# Patient Record
Sex: Male | Born: 1975 | Race: White | Hispanic: No | Marital: Single | Smoking: Current some day smoker
Health system: Southern US, Community
[De-identification: ages and names within clinical notes are randomized; demographics above are authoritative.]

---

## 1999-09-08 ENCOUNTER — Encounter: Payer: Self-pay | Admitting: Emergency Medicine

## 1999-09-08 ENCOUNTER — Emergency Department (HOSPITAL_COMMUNITY): Admission: EM | Admit: 1999-09-08 | Discharge: 1999-09-08 | Payer: Self-pay | Admitting: Emergency Medicine

## 2001-08-18 ENCOUNTER — Encounter: Payer: Self-pay | Admitting: Emergency Medicine

## 2001-08-18 ENCOUNTER — Emergency Department (HOSPITAL_COMMUNITY): Admission: EM | Admit: 2001-08-18 | Discharge: 2001-08-18 | Payer: Self-pay | Admitting: Emergency Medicine

## 2001-11-26 ENCOUNTER — Encounter: Payer: Self-pay | Admitting: Emergency Medicine

## 2001-11-26 ENCOUNTER — Emergency Department (HOSPITAL_COMMUNITY): Admission: EM | Admit: 2001-11-26 | Discharge: 2001-11-26 | Payer: Self-pay | Admitting: Emergency Medicine

## 2004-10-03 ENCOUNTER — Emergency Department (HOSPITAL_COMMUNITY): Admission: EM | Admit: 2004-10-03 | Discharge: 2004-10-03 | Payer: Self-pay | Admitting: Emergency Medicine

## 2007-05-21 ENCOUNTER — Emergency Department (HOSPITAL_COMMUNITY): Admission: EM | Admit: 2007-05-21 | Discharge: 2007-05-22 | Payer: Self-pay | Admitting: Emergency Medicine

## 2007-05-22 ENCOUNTER — Ambulatory Visit: Payer: Self-pay | Admitting: Psychiatry

## 2007-05-22 ENCOUNTER — Inpatient Hospital Stay (HOSPITAL_COMMUNITY): Admission: AD | Admit: 2007-05-22 | Discharge: 2007-05-25 | Payer: Self-pay | Admitting: Psychiatry

## 2009-03-14 ENCOUNTER — Emergency Department (HOSPITAL_COMMUNITY): Admission: EM | Admit: 2009-03-14 | Discharge: 2009-03-14 | Payer: Self-pay | Admitting: Emergency Medicine

## 2009-07-31 ENCOUNTER — Emergency Department (HOSPITAL_COMMUNITY): Admission: EM | Admit: 2009-07-31 | Discharge: 2009-07-31 | Payer: Self-pay | Admitting: Emergency Medicine

## 2011-01-31 ENCOUNTER — Emergency Department (HOSPITAL_COMMUNITY)
Admission: EM | Admit: 2011-01-31 | Discharge: 2011-01-31 | Disposition: A | Discharge status: Home / Self Care | Payer: Self-pay | Attending: Emergency Medicine | Admitting: Emergency Medicine

## 2011-01-31 DIAGNOSIS — H9209 Otalgia, unspecified ear: Secondary | ICD-10-CM | POA: Insufficient documentation

## 2011-01-31 DIAGNOSIS — J029 Acute pharyngitis, unspecified: Secondary | ICD-10-CM | POA: Insufficient documentation

## 2011-01-31 LAB — URINALYSIS, ROUTINE W REFLEX MICROSCOPIC
Glucose, UA: 100 mg/dL — AB
Hgb urine dipstick: NEGATIVE
Ketones, ur: 15 mg/dL — AB
Leukocytes, UA: NEGATIVE
Nitrite: NEGATIVE
Protein, ur: 30 mg/dL — AB
Specific Gravity, Urine: 1.034 — ABNORMAL HIGH (ref 1.005–1.030)
Urobilinogen, UA: 1 mg/dL (ref 0.0–1.0)
pH: 7 (ref 5.0–8.0)

## 2011-01-31 LAB — URINE MICROSCOPIC-ADD ON

## 2011-01-31 LAB — RAPID STREP SCREEN (MED CTR MEBANE ONLY): Streptococcus, Group A Screen (Direct): NEGATIVE
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
## 2011-04-06 NOTE — Discharge Summary (Signed)
NAME:  Vernon Reyes, Vernon Reyes                ACCOUNT NO.:  [id number]   MEDICAL RECORD NO.:  [id number]          PATIENT TYPE:  IPS   LOCATION:  0500                          FACILITY:  [facility]   PHYSICIAN:  Geoffery Lyons, M.D.      DATE OF BIRTH:  1976/08/09   DATE OF ADMISSION:  05/22/2007  DATE OF DISCHARGE:  05/25/2007                               DISCHARGE SUMMARY   CHIEF COMPLAINT AND PRESENT ILLNESS:  This was the first admission to  [facility] for this 35 year old white male,  voluntarily admitted.  Presented in the emergency room, reported  suicidal thoughts.  Endorsed having suicidal thoughts for about 3 days  with a plan to overdose.  No history of prior attempts.  Endorsed  suicidal thoughts come from his misery of being addicted to alcohol and  drugs.  Using cocaine, alcohol and marijuana regularly most days.  Last  relapse 1-1/2 months after 5 months of abstinence.  Claimed that the  relapse was triggered just by the boredom and irritability with the AA  meetings, and the urge to use.  Longest period of abstinence 4 years.  Requesting detox from alcohol.  Has been working to get accepted at [facility].   PAST PSYCHIATRIC HISTORY:  First time at [facility].  Has history  of prior admissions, most recently to [facility] in 2007 and [facility].  History of 10 years of polysubstance dependence.  Had been active  in AA in the past to remain abstinent.  Denies any history of mood  disorders or attention.   ALCOHOL AND DRUG HISTORY:  As already stated, persistent use of  substances.  See history of present illness.   MEDICAL HISTORY:  Noncontributory.   MEDICATIONS:  None.   PHYSICAL EXAMINATION:  Performed.  Failed to show any acute findings.   LABORATORY DATA:  Magnesium 2.2.  UDS positive for cocaine.  Blood  chemistry within normal limits.   MENTAL STATUS EXAM:  An alert, cooperative male, somewhat disheveled.  Engaged in conversations.  Speech was  normal in pace, tone and  production.  Mood was anxious.  Thought process was logical, coherent  and relevant.  No suicidal/homicidal ideas.  No delusions.  No  hallucinations.  Cognition well-preserved.   ADMISSION DIAGNOSES:  AXIS I:  Alcohol, cocaine, marijuana abuse; rule  out dependence, rule out substance-induced mood disorder.  AXIS II:  No diagnosis.  AXIS III:  No diagnosis.  AXIS IV:  Moderate.  AXIS V:  Upon admission 28.  Highest global assessment of functioning in  the last year 60.   COURSE IN THE HOSPITAL:  He was admitted.  He was started in individual  and group psychotherapy.  He was detoxified with Librium.  He was given  trazodone for sleep.  Endorse that he was dealing with a lot of shame  because he used to come as a speaker to [employer].  Can not quit using cocaine.  Has been abstinent up to 2-1/2 months.  Relapses after he gets bored.  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 and has been staying in the streets this last  week.  Had been working towards going to [facility].  Encouraged,  motivated, as he felt this it was it for him.  Endorsed also issues with  relationships, some codependency.  If he was not in a relationship he  would be using.  When he was in [place], he claimed he got more so into  a relationship with a male, and got out of track.  Endorsed that he  did know that he needed to do this for himself.  We continued to work to  detox.  Worked on relapse prevention plan.  By July 6th, he was in full  contact with reality.  There were no active suicidal/homicidal ideas.  No hallucinations, no delusions.  He was going to be staying with a  friend on this night, and will go to [facility] in the morning.  Endorsed he was ready to make a change.   DISCHARGE DIAGNOSES:  AXIS I:  Cocaine dependence.  Alcohol, marijuana  abuse.  Substance-induced mood disorder.  AXIS II:  No diagnosis.  AXIS III:  No  diagnosis.  AXIS IV: Moderate.  AXIS V:  Upon discharge 60.   Discharged on no medications.   To follow up at [facility].      Geoffery Lyons, M.D.  Electronically Signed     IL/MEDQ  D:  06/23/2007  T:  06/24/2007  Job:  [id number]

## 2011-09-04 LAB — HEPATIC FUNCTION PANEL
ALT: 13
Alkaline Phosphatase: 74
Bilirubin, Direct: 0.1
Indirect Bilirubin: 0.5
Total Bilirubin: 0.6

## 2011-09-04 LAB — RAPID URINE DRUG SCREEN, HOSP PERFORMED
Barbiturates: NOT DETECTED
Cocaine: POSITIVE — AB
Opiates: NOT DETECTED
Tetrahydrocannabinol: NOT DETECTED

## 2011-09-04 LAB — CBC
Hemoglobin: 13.5
RBC: 4.25
RDW: 13

## 2011-09-04 LAB — BASIC METABOLIC PANEL
Calcium: 9.1
GFR calc Af Amer: >60
GFR calc non Af Amer: >60
Glucose, Bld: 90
Sodium: 139

## 2011-09-04 LAB — DIFFERENTIAL
Basophils Absolute: 0.1
Lymphocytes Relative: 41
Monocytes Absolute: 0.9 — ABNORMAL HIGH
Neutro Abs: 4.1

## 2014-06-12 ENCOUNTER — Emergency Department (HOSPITAL_COMMUNITY)
Admission: EM | Admit: 2014-06-12 | Discharge: 2014-06-12 | Disposition: A | Discharge status: Home / Self Care | Payer: PRIVATE HEALTH INSURANCE | Attending: Emergency Medicine | Admitting: Emergency Medicine

## 2014-06-12 ENCOUNTER — Emergency Department (HOSPITAL_COMMUNITY): Payer: PRIVATE HEALTH INSURANCE

## 2014-06-12 ENCOUNTER — Encounter (HOSPITAL_COMMUNITY): Payer: Self-pay | Admitting: Emergency Medicine

## 2014-06-12 DIAGNOSIS — IMO0001 Reserved for inherently not codable concepts without codable children: Secondary | ICD-10-CM | POA: Insufficient documentation

## 2014-06-12 DIAGNOSIS — R4182 Altered mental status, unspecified: Secondary | ICD-10-CM | POA: Insufficient documentation

## 2014-06-12 DIAGNOSIS — F17200 Nicotine dependence, unspecified, uncomplicated: Secondary | ICD-10-CM | POA: Insufficient documentation

## 2014-06-12 DIAGNOSIS — Z008 Encounter for other general examination: Secondary | ICD-10-CM | POA: Insufficient documentation

## 2014-06-12 DIAGNOSIS — F1410 Cocaine abuse, uncomplicated: Secondary | ICD-10-CM | POA: Insufficient documentation

## 2014-06-12 LAB — I-STAT TROPONIN, ED: Troponin i, poc: 0 ng/mL (ref 0.00–0.08)

## 2014-06-12 LAB — COMPREHENSIVE METABOLIC PANEL
ALK PHOS: 75 U/L (ref 39–117)
ALT: 9 U/L (ref 0–53)
ANION GAP: 13 (ref 5–15)
AST: 13 U/L (ref 0–37)
Albumin: 3.9 g/dL (ref 3.5–5.2)
BILIRUBIN TOTAL: 0.2 mg/dL — AB (ref 0.3–1.2)
BUN: 24 mg/dL — AB (ref 6–23)
CO2: 26 meq/L (ref 19–32)
Calcium: 9.2 mg/dL (ref 8.4–10.5)
Chloride: 105 mEq/L (ref 96–112)
Creatinine, Ser: 1.11 mg/dL (ref 0.50–1.35)
GFR, EST NON AFRICAN AMERICAN: 82 mL/min — AB (ref >90)
GLUCOSE: 105 mg/dL — AB (ref 70–99)
POTASSIUM: 3.7 meq/L (ref 3.7–5.3)
SODIUM: 144 meq/L (ref 137–147)
Total Protein: 6.9 g/dL (ref 6.0–8.3)

## 2014-06-12 LAB — CBC WITH DIFFERENTIAL/PLATELET
BASOS ABS: 0 10*3/uL (ref 0.0–0.1)
BASOS PCT: 0 % (ref 0–1)
EOS ABS: 0.4 10*3/uL (ref 0.0–0.7)
Eosinophils Relative: 7 % — ABNORMAL HIGH (ref 0–5)
HCT: 39.1 % (ref 39.0–52.0)
Hemoglobin: 13.1 g/dL (ref 13.0–17.0)
Lymphocytes Relative: 33 % (ref 12–46)
Lymphs Abs: 2.1 10*3/uL (ref 0.7–4.0)
MCH: 30 pg (ref 26.0–34.0)
MCHC: 33.5 g/dL (ref 30.0–36.0)
MCV: 89.5 fL (ref 78.0–100.0)
MONOS PCT: 8 % (ref 3–12)
Monocytes Absolute: 0.5 10*3/uL (ref 0.1–1.0)
NEUTROS PCT: 52 % (ref 43–77)
Neutro Abs: 3.3 10*3/uL (ref 1.7–7.7)
PLATELETS: 218 10*3/uL (ref 150–400)
RBC: 4.37 MIL/uL (ref 4.22–5.81)
RDW: 12.6 % (ref 11.5–15.5)
WBC: 6.4 10*3/uL (ref 4.0–10.5)

## 2014-06-12 LAB — URINALYSIS, ROUTINE W REFLEX MICROSCOPIC
Bilirubin Urine: NEGATIVE
GLUCOSE, UA: NEGATIVE mg/dL
Hgb urine dipstick: NEGATIVE
KETONES UR: NEGATIVE mg/dL
LEUKOCYTES UA: NEGATIVE
Nitrite: NEGATIVE
PROTEIN: 30 mg/dL — AB
Specific Gravity, Urine: 1.025 (ref 1.005–1.030)
UROBILINOGEN UA: 1 mg/dL (ref 0.0–1.0)
pH: 6 (ref 5.0–8.0)

## 2014-06-12 LAB — ACETAMINOPHEN LEVEL

## 2014-06-12 LAB — RAPID URINE DRUG SCREEN, HOSP PERFORMED
AMPHETAMINES: NOT DETECTED
Barbiturates: NOT DETECTED
Benzodiazepines: NOT DETECTED
Cocaine: POSITIVE — AB
Opiates: NOT DETECTED
TETRAHYDROCANNABINOL: NOT DETECTED

## 2014-06-12 LAB — URINE MICROSCOPIC-ADD ON

## 2014-06-12 LAB — TROPONIN I
Troponin I: <0.3 ng/mL (ref <0.30)
Troponin I: <0.3 ng/mL (ref <0.30)

## 2014-06-12 LAB — SALICYLATE LEVEL

## 2014-06-12 LAB — ETHANOL: Alcohol, Ethyl (B): <11 mg/dL (ref 0–11)

## 2014-06-12 MED ORDER — SODIUM CHLORIDE 0.9 % IV BOLUS (SEPSIS)
1000.0000 mL | Freq: Once | INTRAVENOUS | Status: AC
  Administered 2014-06-12: 1000 mL via INTRAVENOUS

## 2014-06-12 MED ORDER — AMMONIA AROMATIC IN INHA
0.3000 mL | Freq: Once | RESPIRATORY_TRACT | Status: DC
  Filled 2014-06-12: qty 10

## 2014-06-12 NOTE — ED Notes (Signed)
Pt to department via EMS- pt reports that he uses crack and drinks alcohol on a daily basis. Pt reports that the last use was this morning around 0645 for both. Pt was found unresponsive by a bystander and [employer] called. States that he started having nausea/vomiting, and diaphoresis. Pt given 4mg  Zofran IM. Bp-115/78 Hr-61.

## 2014-06-12 NOTE — ED Provider Notes (Signed)
CSN: [id number]     Arrival date & time 06/12/14  6962 History   First MD Initiated Contact with Patient 06/12/14 [phone]     Chief Complaint  Patient presents with  . Medical Clearance  . Nausea   HPI Comments: Patient is a 38 y.o. Male who presents to the [facility] ED via EMS after being found unrepsonsive in his car in a parking lot by police.  Police took the patient into custody originally and said the patient was lethargic and had some slurred speech.  Patient told the police that he had done cocaine and drank.  En route to jail the patient became very diaphoretic and stated that he didn't feel good to the officers.  He was transferred by EMS to the hospital at that time.  Patient was unable to provide any history given mental status change.    The history is provided by the patient. No language interpreter was used.    History reviewed. No pertinent past medical history. History reviewed. No pertinent past surgical history. History reviewed. No pertinent family history. History  Substance Use Topics  . Smoking status: Current Some Day Smoker  . Smokeless tobacco: Not on file  . Alcohol Use: Yes    Review of Systems  Unable to perform ROS: Mental status change   2:00 pm Patient was awake and alert enough to have a conversation.   He states that he has some generalized myalgia and is very tired at this time.  He states that he has been up for multiple days on a bender.  He denies chest pain, shortness of breath, belly pain.   Allergies  Review of patient's allergies indicates no known allergies.  Home Medications   Prior to Admission medications   Not on File   BP 120/82  Pulse 65  Temp(Src) 97.6 F (36.4 C) (Oral)  Resp 13  SpO2 100% Physical Exam  Nursing note and vitals reviewed. Constitutional: He appears well-developed and well-nourished. No distress.  HENT:  Head: Normocephalic and atraumatic.  Mouth/Throat: Oropharynx is clear and moist. No oropharyngeal exudate.   Eyes: Conjunctivae are normal. Pupils are equal, round, and reactive to light.  Pupils 2 mm bilaterally  Neck: Normal range of motion. Neck supple. No JVD present. No thyromegaly present.  Cardiovascular: Normal rate, regular rhythm, normal heart sounds and intact distal pulses.  Exam reveals no gallop and no friction rub.   No murmur heard. Pulmonary/Chest: Effort normal and breath sounds normal. No respiratory distress. He has no wheezes. He has no rales. He exhibits no tenderness.  Abdominal: Soft. Bowel sounds are normal. He exhibits no distension and no mass. There is no tenderness. There is no rebound and no guarding.  Musculoskeletal: Normal range of motion.  Lymphadenopathy:    He has no cervical adenopathy.  Neurological:  GCS score 9, Patient is lethargic but will respond to sternal rub.    Skin: Skin is warm and dry. He is not diaphoretic.   2:30 pm - Patient is alert and oriented x 4.  GCS 15.  ED Course  Procedures (including critical care time) Labs Review Labs Reviewed  COMPREHENSIVE METABOLIC PANEL - Abnormal; Notable for the following:    Glucose, Bld 105 (*)    BUN 24 (*)    Total Bilirubin 0.2 (*)    GFR calc non Af Amer 82 (*)    All other components within normal limits  CBC WITH DIFFERENTIAL - Abnormal; Notable for the following:    Eosinophils  Relative 7 (*)    All other components within normal limits  URINALYSIS, ROUTINE W REFLEX MICROSCOPIC - Abnormal; Notable for the following:    Protein, ur 30 (*)    All other components within normal limits  URINE RAPID DRUG SCREEN (HOSP PERFORMED) - Abnormal; Notable for the following:    Cocaine POSITIVE (*)    All other components within normal limits  SALICYLATE LEVEL - Abnormal; Notable for the following:    Salicylate Lvl <2.0 (*)    All other components within normal limits  TROPONIN I  ACETAMINOPHEN LEVEL  ETHANOL  URINE MICROSCOPIC-ADD ON  TROPONIN I  Rosezena SensorI-STAT TROPOININ, ED    Imaging Review Dg  Chest 2 View  06/12/2014   CLINICAL DATA:  MEDICAL CLEARANCE NAUSEA  EXAM: CHEST - 2 VIEW  COMPARISON:  None available  FINDINGS: Lungs are clear. Heart size and mediastinal contours are within normal limits. No effusion. Visualized skeletal structures are unremarkable.  IMPRESSION: No acute cardiopulmonary disease.   Electronically Signed   By: Oley Balmaniel  Hassell M.D.   On: 06/12/2014 15:43   Ct Head Wo Contrast  06/12/2014   CLINICAL DATA:  Nausea.  EXAM: CT HEAD WITHOUT CONTRAST  TECHNIQUE: Contiguous axial images were obtained from the base of the skull through the vertex without intravenous contrast.  COMPARISON:  No priors.  FINDINGS: No acute intracranial abnormalities. Specifically, no evidence of acute intracranial hemorrhage, no definite findings of acute/subacute cerebral ischemia, no mass, mass effect, hydrocephalus or abnormal intra or extra-axial fluid collections. Visualized paranasal sinuses and mastoids are well pneumatized, with exception of the right maxillary sinus. The right maxillary sinus is completely opacified with secretions that are heterogeneous in attenuation (likely inspissated), and appears mildly expansile, suggesting a chronic mucocele. No acute displaced skull fractures are identified.  IMPRESSION: *No acute intracranial abnormalities. *The appearance of the brain is normal. *Right maxillary sinus mucocele.   Electronically Signed   By: Trudie Reedaniel  Entrikin M.D.   On: 06/12/2014 10:46     EKG Interpretation   Date/Time:  Saturday June 12 2014 15:43:30 EDT Ventricular Rate:  59 PR Interval:  186 QRS Duration: 103 QT Interval:  413 QTC Calculation: 409 R Axis:   77 Text Interpretation:  Sinus rhythm RSR' in V1 or V2, probably normal  variant ST elev, probable normal early repol pattern No significant change  was found Confirmed by Manus GunningANCOUR  MD, STEPHEN [phone]([id number]) on 06/12/2014 4:11:33  PM      MDM   Final diagnoses:  Cocaine abuse  Altered mental status, unspecified  altered mental status type   Patient presents to the ED via EMS for nausea, substance abuse, and altered mental status.  Patient was found to be cocaine positive here in the ED.  Alcohol, salicylate, tylenol levels were negative here in the ED.  UA shows no signs of infection at this time.  Given cocaine use patient had CXR which was negative for acute cardiopulmonary pathology at this time.  Initial troponin was negative.  EKG showed diffuse J point elevation in nearly all leads as read by Dr. Manus Gunningancour.  Delta troponin here is negative.  Given altered mental status upon arrival head CT was performed.  Head CT shows no acute abnormalities at this time.  Patient is stable at this time and can be taken into the custody of the [employer].  Patient ambulated in the hall and was able to tolerate PO prior to leaving.  Patient was seen by Dr. Manus Gunningancour alongside me and he  is comfortable with the above plan.        Eben Burow, PA-C 06/12/14 [phone]

## 2014-06-12 NOTE — ED Notes (Signed)
Spoke to [place] in lab regarding ETOH, acetaminophen, and salicylate delay. Lab reports needing a redraw for testing.

## 2014-06-12 NOTE — ED Notes (Signed)
Pt refusing in and out cath. Pt attempting to use urinal again

## 2014-06-12 NOTE — ED Notes (Signed)
Dr. Rancour at bedside. 

## 2014-06-12 NOTE — ED Notes (Signed)
Pt asleep; unable to provide urine sample. Pt to be cathed for UDS

## 2014-06-12 NOTE — ED Notes (Signed)
PA at bedside.

## 2014-06-12 NOTE — ED Notes (Signed)
Pt more lethargic. Ammonia inhalant used; pt alert at this time. Attempting to use urinal at this time

## 2014-06-12 NOTE — ED Provider Notes (Signed)
Medical screening examination/treatment/procedure(s) were conducted as a shared visit with non-physician practitioner(s) and myself.  I personally evaluated the patient during the encounter.  BIB police after being found sleeping in car.  Endorses cocaine use.  No trauma.  Sleepy, arousable to pain and voice.  Oriented x3.  No meningismus. Denies head, neck, chest, abdominal, or back pain. No focal neuro deficits.   EKG Interpretation   Date/Time:  Saturday June 12 2014 15:43:30 EDT Ventricular Rate:  59 PR Interval:  186 QRS Duration: 103 QT Interval:  413 QTC Calculation: 409 R Axis:   77 Text Interpretation:  Sinus rhythm RSR' in V1 or V2, probably normal  variant ST elev, probable normal early repol pattern No significant change  was found Confirmed by Manus GunningANCOUR  MD, Nihal Marzella [phone]([id number]) on 06/12/2014 4:11:33  PM       Glynn OctaveStephen Zanyiah Posten, MD 06/12/14 [phone]

## 2014-06-12 NOTE — ED Notes (Signed)
Spoke to Vernon Reyes in the main lab about UDS; reports UDS not ran at time of collection; test in progree at this time; PA aware

## 2014-06-12 NOTE — Discharge Instructions (Signed)
Stimulant Use Disorder-Cocaine °Cocaine is one of a group of powerful drugs called stimulants. Cocaine has medical uses for stopping nosebleeds and for pain control before minor nose or dental surgery. However, cocaine is misused because of the effects that it produces. These effects include:  °· A feeling of extreme pleasure. °· Alertness. °· High energy. °Common street names for cocaine include coke, crack, blow, snow, and nose candy. Cocaine is snorted, dissolved in water and injected, or smoked.  °Stimulants are addictive because they activate regions of the brain that produce both the pleasurable sensation of "reward" and psychological dependence. Together, these actions account for loss of control and the rapid development of drug dependence. This means you become ill without the drug (withdrawal) and need to keep using it to function.  °Stimulant use disorder is use of stimulants that disrupts your daily life. It disrupts relationships with family and friends and how you do your job. Cocaine increases your blood pressure and heart rate. It can cause a heart attack or stroke. Cocaine can also cause death from irregular heart rate or seizures. °SYMPTOMS °Symptoms of stimulant use disorder with cocaine include: °· Use of cocaine in larger amounts or over a longer period of time than intended. °· Unsuccessful attempts to cut down or control cocaine use. °· A lot of time spent obtaining, using, or recovering from the effects of cocaine. °· A strong desire or urge to use cocaine (craving). °· Continued use of cocaine in spite of major problems at work, school, or home because of use. °· Continued use of cocaine in spite of relationship problems because of use. °· Giving up or cutting down on important life activities because of cocaine use. °· Use of cocaine over and over in situations when it is physically hazardous, such as driving a car. °· Continued use of cocaine in spite of a physical problem that is likely  related to use. Physical problems can include: °¨ Malnutrition. °¨ Nosebleeds. °¨ Chest pain. °¨ High blood pressure. °¨ A hole that develops between the part of your nose that separates your nostrils (perforated nasal septum). °¨ Lung and kidney damage. °· Continued use of cocaine in spite of a mental problem that is likely related to use. Mental problems can include: °¨ Schizophrenia-like symptoms. °¨ Depression. °¨ Bipolar mood swings. °¨ Anxiety. °¨ Sleep problems. °· Need to use more and more cocaine to get the same effect, or lessened effect over time with use of the same amount of cocaine (tolerance). °· Having withdrawal symptoms when cocaine use is stopped, or using cocaine to reduce or avoid withdrawal symptoms. Withdrawal symptoms include: °¨ Depressed or irritable mood. °¨ Low energy or restlessness. °¨ Bad dreams. °¨ Poor or excessive sleep. °¨ Increased appetite. °DIAGNOSIS °Stimulant use disorder is diagnosed by your health care provider. You may be asked questions about your cocaine use and how it affects your life. A physical exam may be done. A drug screen may be ordered. You may be referred to a mental health professional. The diagnosis of stimulant use disorder requires at least two symptoms within 12 months. The type of stimulant use disorder depends on the number of signs and symptoms you have. The type may be: °· Mild. Two or three signs and symptoms. °· Moderate. Four or five signs and symptoms. °· Severe. Six or more signs and symptoms. °TREATMENT °Treatment for stimulant use disorder is usually provided by mental health professionals with training in substance use disorders. The following options are available: °·   Counseling or talk therapy. Talk therapy addresses the reasons you use cocaine and ways to keep you from using again. Goals of talk therapy include: °¨ Identifying and avoiding triggers for use. °¨ Handling cravings. °¨ Replacing use with healthy activities. °· Support groups.  Support groups provide emotional support, advice, and guidance. °· Medicine. Certain medicines may decrease cocaine cravings or withdrawal symptoms. °HOME CARE INSTRUCTIONS °· Take medicines only as directed by your health care provider. °· Identify the people and activities that trigger your cocaine use and avoid them. °· Keep all follow-up visits as directed by your health care provider. °SEEK MEDICAL CARE IF: °· Your symptoms get worse or you relapse. °· You are not able to take medicines as directed. °SEEK IMMEDIATE MEDICAL CARE IF: °· You have serious thoughts about hurting yourself or others. °· You have a seizure, chest pain, sudden weakness, or loss of speech or vision. °FOR MORE INFORMATION °· National Institute on Drug Abuse: www.drugabuse.gov °· Substance Abuse and Mental Health Services Administration: www.samhsa.gov °Document Released: 11/02/2000 Document Revised: 03/22/2014 Document Reviewed: 11/18/2013 °ExitCare® Patient Information ©2015 [employer]. This information is not intended to replace advice given to you by your health care provider. Make sure you discuss any questions you have with your health care provider. ° °

## 2014-06-12 NOTE — ED Notes (Signed)
Pt in [employer] custody after being found "unresponsive" in car . Pt denies pain at this time. Pt reports ETOH and crack use. Last ETOH use today; Last crack use an hour ago.

## 2014-08-16 ENCOUNTER — Emergency Department (HOSPITAL_COMMUNITY)
Admission: EM | Admit: 2014-08-16 | Discharge: 2014-08-16 | Disposition: A | Discharge status: Home / Self Care | Payer: PRIVATE HEALTH INSURANCE | Attending: Emergency Medicine | Admitting: Emergency Medicine

## 2014-08-16 ENCOUNTER — Encounter (HOSPITAL_COMMUNITY): Payer: Self-pay | Admitting: Emergency Medicine

## 2014-08-16 DIAGNOSIS — L03317 Cellulitis of buttock: Secondary | ICD-10-CM | POA: Diagnosis present

## 2014-08-16 DIAGNOSIS — L0231 Cutaneous abscess of buttock: Secondary | ICD-10-CM | POA: Diagnosis present

## 2014-08-16 DIAGNOSIS — R110 Nausea: Secondary | ICD-10-CM | POA: Diagnosis not present

## 2014-08-16 DIAGNOSIS — F17200 Nicotine dependence, unspecified, uncomplicated: Secondary | ICD-10-CM | POA: Insufficient documentation

## 2014-08-16 MED ORDER — IBUPROFEN 800 MG PO TABS: 800.0000 mg | ORAL_TABLET | Freq: Three times a day (TID) | ORAL | Status: AC

## 2014-08-16 MED ORDER — DOXYCYCLINE HYCLATE 100 MG PO TABS
100.0000 mg | ORAL_TABLET | Freq: Once | ORAL | Status: AC
  Administered 2014-08-16: 100 mg via ORAL
  Filled 2014-08-16: qty 1

## 2014-08-16 MED ORDER — DOXYCYCLINE HYCLATE 100 MG PO CAPS: 100.0000 mg | ORAL_CAPSULE | Freq: Two times a day (BID) | ORAL | Status: AC

## 2014-08-16 MED ORDER — LIDOCAINE HCL (PF) 2 % IJ SOLN
10.0000 mL | Freq: Once | INTRAMUSCULAR | Status: AC
  Administered 2014-08-16: 10 mL via INTRADERMAL
  Filled 2014-08-16: qty 10

## 2014-08-16 MED ORDER — IBUPROFEN 800 MG PO TABS
800.0000 mg | ORAL_TABLET | Freq: Once | ORAL | Status: AC
  Administered 2014-08-16: 800 mg via ORAL

## 2014-08-16 MED ORDER — IBUPROFEN 800 MG PO TABS
ORAL_TABLET | ORAL | Status: AC
  Administered 2014-08-16: 800 mg via ORAL
  Filled 2014-08-16: qty 1

## 2014-08-16 MED ORDER — OXYCODONE-ACETAMINOPHEN 5-325 MG PO TABS
1.0000 | ORAL_TABLET | Freq: Once | ORAL | Status: AC
  Administered 2014-08-16: 1 via ORAL
  Filled 2014-08-16: qty 1

## 2014-08-16 NOTE — ED Notes (Signed)
Pt comes in from [place], officer with pt, pt has a large boil on buttock,onset 2 weeks, pt has been asking for antibiotic from nurse, pt has been given motrin. Pt is concerned cause he lost index finger 2004 due to skin infection ORSA
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
## 2014-08-16 NOTE — Discharge Instructions (Signed)
Abscess °Care After °An abscess (also called a boil or furuncle) is an infected area that contains a collection of pus. Signs and symptoms of an abscess include pain, tenderness, redness, or hardness, or you may feel a moveable soft area under your skin. An abscess can occur anywhere in the body. The infection may spread to surrounding tissues causing cellulitis. A cut (incision) by the surgeon was made over your abscess and the pus was drained out. Gauze may have been packed into the space to provide a drain that will allow the cavity to heal from the inside outwards. The boil may be painful for 5 to 7 days. Most people with a boil do not have high fevers. Your abscess, if seen early, may not have localized, and may not have been lanced. If not, another appointment may be required for this if it does not get better on its own or with medications. °HOME CARE INSTRUCTIONS  °· Only take over-the-counter or prescription medicines for pain, discomfort, or fever as directed by your caregiver. °· When you bathe, soak and then remove gauze or iodoform packs at least daily or as directed by your caregiver. You may then wash the wound gently with mild soapy water. Repack with gauze or do as your caregiver directs. °SEEK IMMEDIATE MEDICAL CARE IF:  °· You develop increased pain, swelling, redness, drainage, or bleeding in the wound site. °· You develop signs of generalized infection including muscle aches, chills, fever, or a general ill feeling. °· An oral temperature above 102° F (38.9° C) develops, not controlled by medication. °See your caregiver for a recheck if you develop any of the symptoms described above. If medications (antibiotics) were prescribed, take them as directed. °Document Released: 05/24/2005 Document Revised: 01/28/2012 Document Reviewed: 01/19/2008 °[employer]® Patient Information ©2015 [employer]. This information is not intended to replace advice given to you by your health care provider. Make sure  you discuss any questions you have with your health care provider. ° °Abscess °An abscess (boil or furuncle) is an infected area on or under the skin. This area is filled with yellowish-white fluid (pus) and other material (debris). °HOME CARE  °· Only take medicines as told by your doctor. °· If you were given antibiotic medicine, take it as directed. Finish the medicine even if you start to feel better. °· If gauze is used, follow your doctor's directions for changing the gauze. °· To avoid spreading the infection: °¨ Keep your abscess covered with a bandage. °¨ Wash your hands well. °¨ Do not share personal care items, towels, or whirlpools with others. °¨ Avoid skin contact with others. °· Keep your skin and clothes clean around the abscess. °· Keep all doctor visits as told. °GET HELP RIGHT AWAY IF:  °· You have more pain, puffiness (swelling), or redness in the wound site. °· You have more fluid or blood coming from the wound site. °· You have muscle aches, chills, or you feel sick. °· You have a fever. °MAKE SURE YOU:  °· Understand these instructions. °· Will watch your condition. °· Will get help right away if you are not doing well or get worse. °Document Released: 04/23/2008 Document Revised: 05/06/2012 Document Reviewed: 01/18/2012 °[employer]® Patient Information ©2015 [employer]. This information is not intended to replace advice given to you by your health care provider. Make sure you discuss any questions you have with your health care provider. ° °
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
## 2014-08-19 NOTE — ED Provider Notes (Signed)
Medical screening examination/treatment/procedure(s) were performed by non-physician practitioner and as supervising physician I was immediately available for consultation/collaboration.   EKG Interpretation None        Bonnell Placzek, MD 08/19/14 0700 

## 2015-03-28 IMAGING — CT CT HEAD W/O CM
2 series · 16 of 30 positions shown, 18 images · non-contrast
Comparison: No priors.

CLINICAL DATA: Nausea.

EXAM:
CT HEAD WITHOUT CONTRAST
TECHNIQUE: Contiguous axial images were obtained from the base of the skull
through the vertex without intravenous contrast.

[Series 201: head w/o, idose (1) · axial · non-contrast · 0.49mm/px · z∈[+111,+226]mm · 8 of 31 slices shown, 10 images]
[im 4/31  brain]
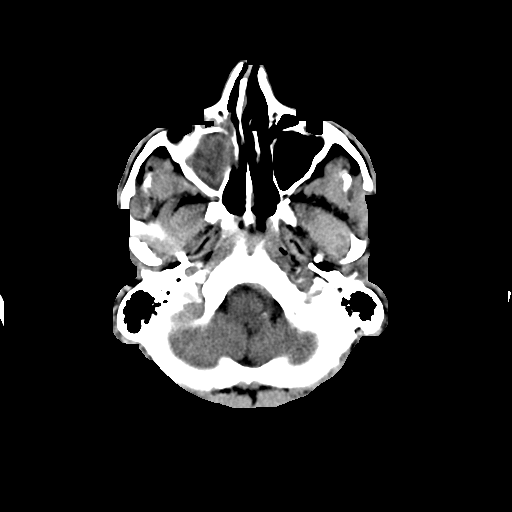
[im 4/31  bone]
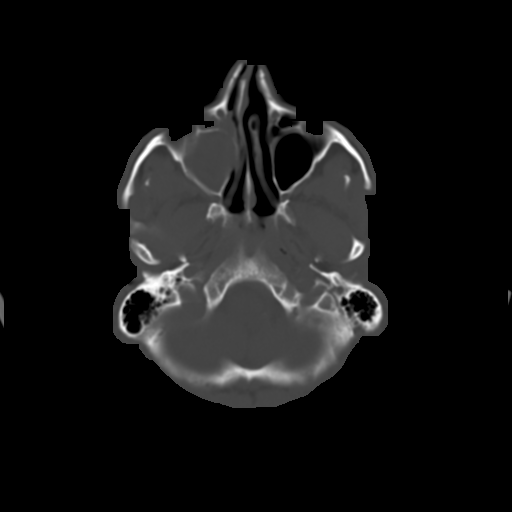
[im 7/31  brain]
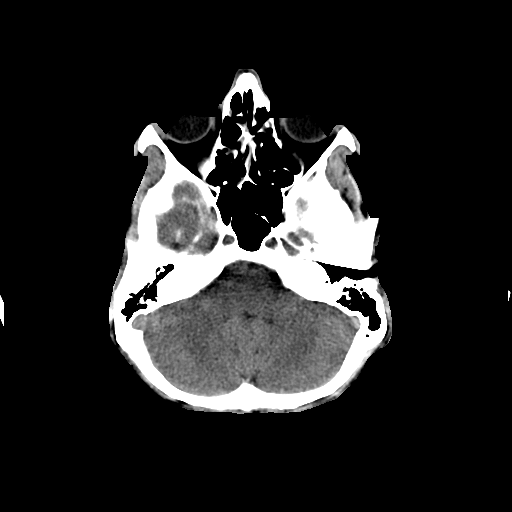
[im 11/31  brain]
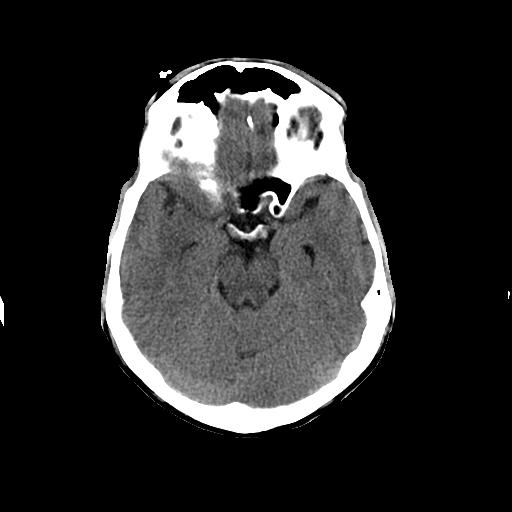
[im 14/31  brain]
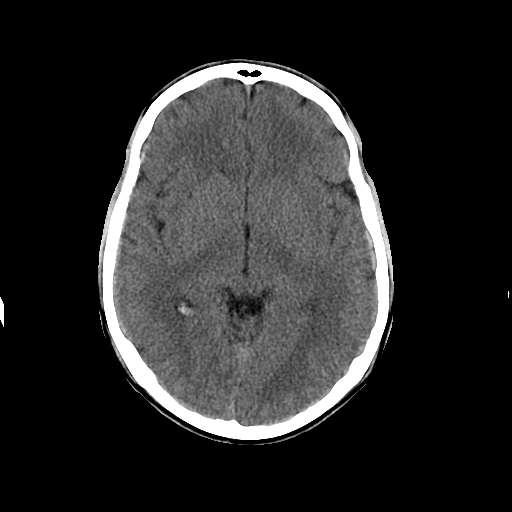
[im 17/31  brain]
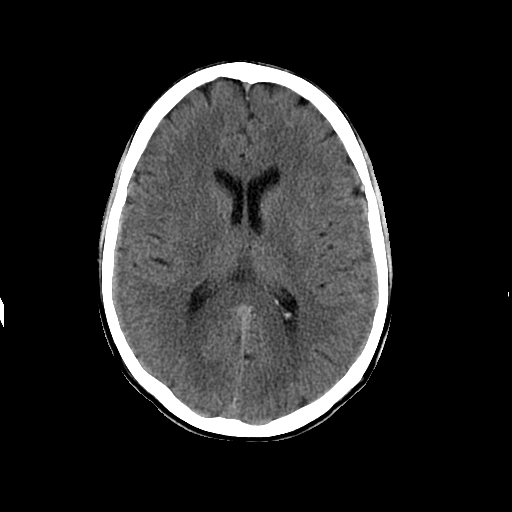
[im 17/31  bone]
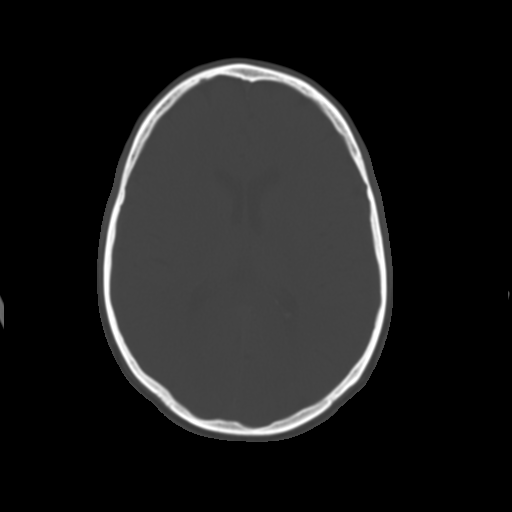
[im 21/31  brain]
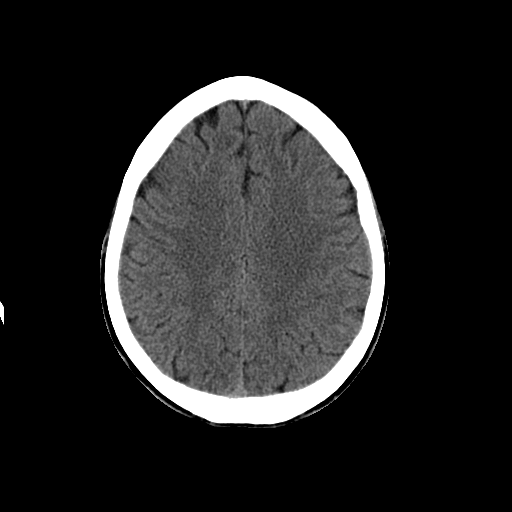
[im 24/31  brain]
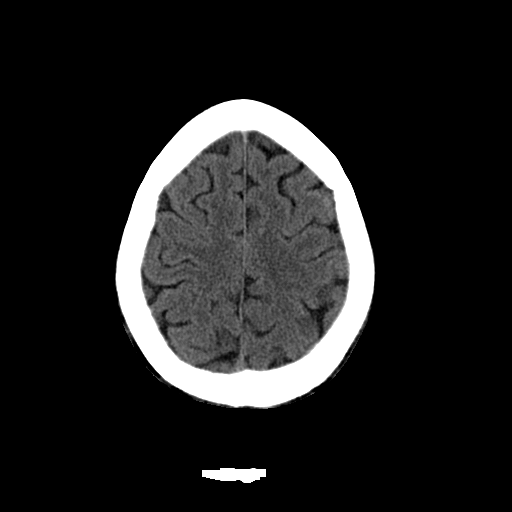
[im 27/31  brain]
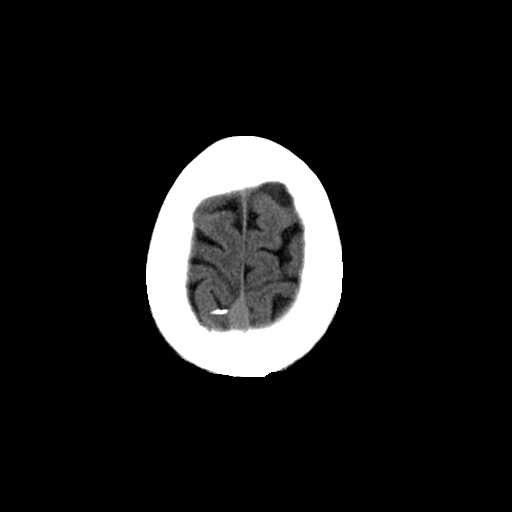

[Series 202: head w/o bone, idose (1) · axial · non-contrast · 0.49mm/px · z∈[+109,+229]mm · 8 of 62 slices shown]
[im 7/62  bone]
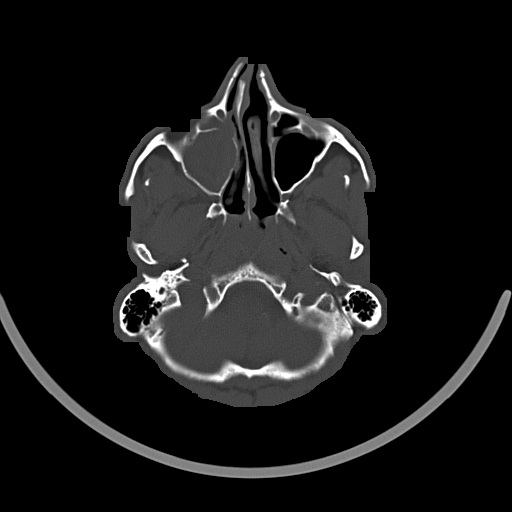
[im 13/62  bone]
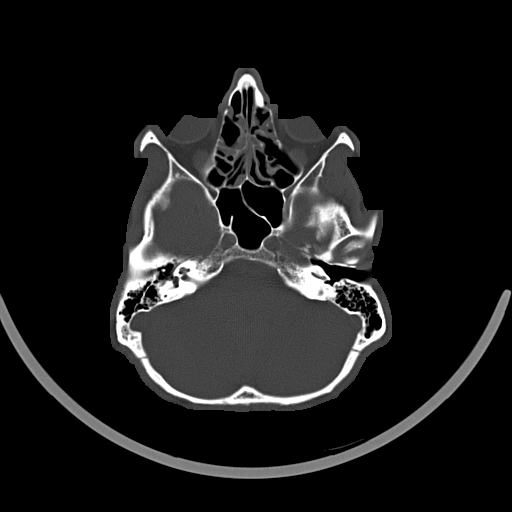
[im 20/62  bone]
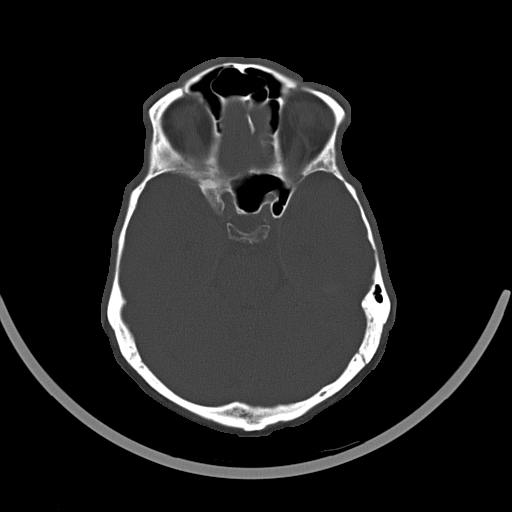
[im 26/62  bone]
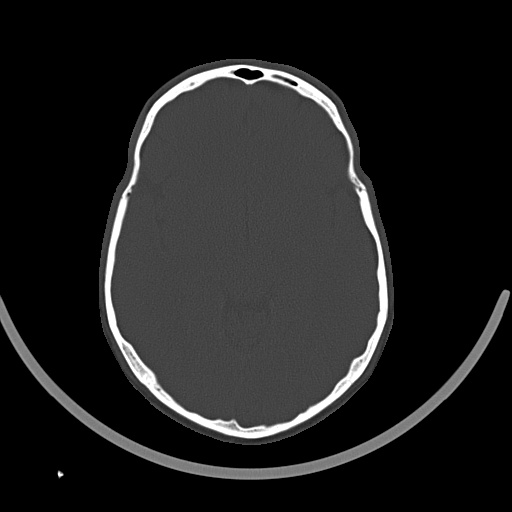
[im 36/62  bone]
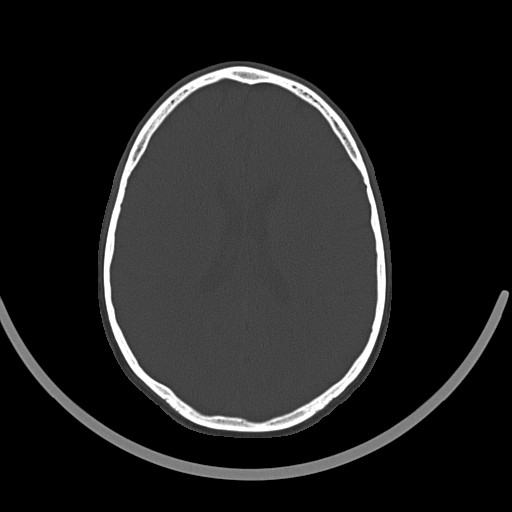
[im 42/62  bone]
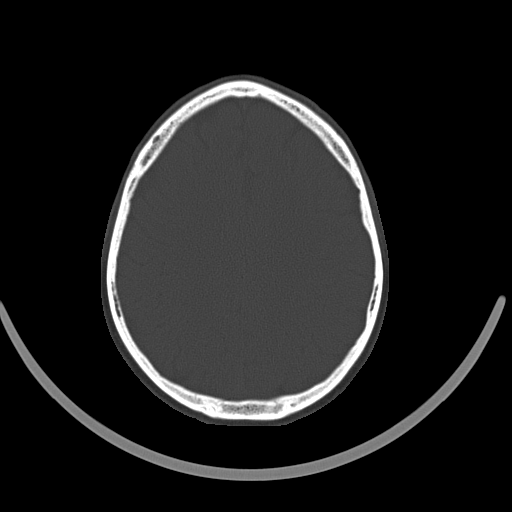
[im 49/62  bone]
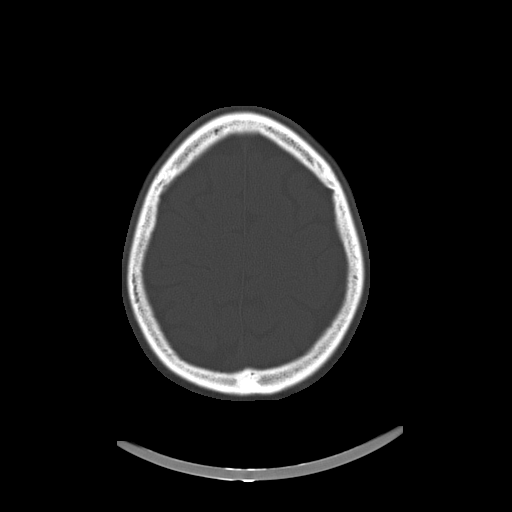
[im 55/62  bone]
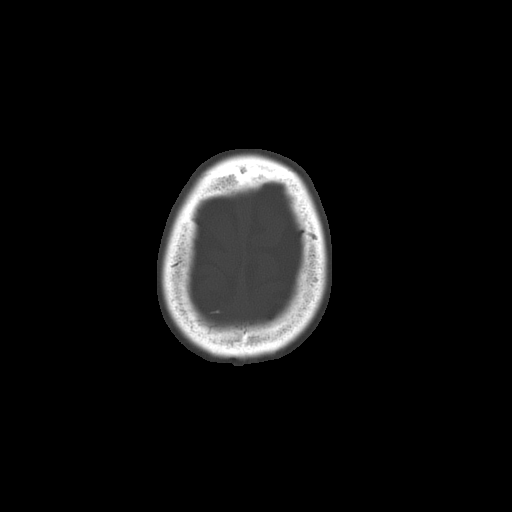

[16 of 30 positions shown; findings below may reference images not displayed]

FINDINGS: No acute intracranial abnormalities. Specifically, no evidence of
acute intracranial hemorrhage, no definite findings of
acute/subacute cerebral ischemia, no mass, mass effect,
hydrocephalus or abnormal intra or extra-axial fluid collections.
Visualized paranasal sinuses and mastoids are well pneumatized, with
exception of the right maxillary sinus. The right maxillary sinus is
completely opacified with secretions that are heterogeneous in
attenuation (likely inspissated), and appears mildly expansile,
suggesting a chronic mucocele. No acute displaced skull fractures
are identified.
IMPRESSION: *No acute intracranial abnormalities.
*The appearance of the brain is normal.
*Right maxillary sinus mucocele.
